# Patient Record
Sex: Male | Born: 1980 | Race: White | Hispanic: No | Marital: Single | Smoking: Never smoker
Health system: Southern US, Community
[De-identification: ages and names within clinical notes are randomized; demographics above are authoritative.]

## PROBLEM LIST (undated history)

## (undated) DIAGNOSIS — J45909 Unspecified asthma, uncomplicated: Secondary | ICD-10-CM

## (undated) HISTORY — PX: HERNIA REPAIR: SHX51

## (undated) HISTORY — PX: LIPOMA RESECTION: SHX23

## (undated) HISTORY — DX: Unspecified asthma, uncomplicated: J45.909

## (undated) HISTORY — PX: TONSILLECTOMY: SUR1361

---

## 2011-11-08 ENCOUNTER — Encounter (INDEPENDENT_AMBULATORY_CARE_PROVIDER_SITE_OTHER): Payer: Self-pay

## 2011-11-18 ENCOUNTER — Ambulatory Visit (INDEPENDENT_AMBULATORY_CARE_PROVIDER_SITE_OTHER): Payer: Self-pay | Admitting: Surgery

## 2012-12-25 ENCOUNTER — Ambulatory Visit (INDEPENDENT_AMBULATORY_CARE_PROVIDER_SITE_OTHER): Payer: Self-pay | Admitting: Surgery

## 2013-05-26 ENCOUNTER — Other Ambulatory Visit: Payer: Self-pay | Admitting: Family Medicine

## 2013-05-26 DIAGNOSIS — N503 Cyst of epididymis: Secondary | ICD-10-CM

## 2013-05-27 ENCOUNTER — Ambulatory Visit
Admission: RE | Admit: 2013-05-27 | Discharge: 2013-05-27 | Disposition: A | Discharge status: Home / Self Care | Payer: No Typology Code available for payment source | Source: Ambulatory Visit | Attending: Family Medicine | Admitting: Family Medicine

## 2013-05-27 DIAGNOSIS — N503 Cyst of epididymis: Secondary | ICD-10-CM

## 2013-06-10 ENCOUNTER — Encounter (INDEPENDENT_AMBULATORY_CARE_PROVIDER_SITE_OTHER): Payer: Self-pay

## 2013-06-10 ENCOUNTER — Encounter (INDEPENDENT_AMBULATORY_CARE_PROVIDER_SITE_OTHER): Payer: Self-pay | Admitting: General Surgery

## 2013-06-10 ENCOUNTER — Ambulatory Visit (INDEPENDENT_AMBULATORY_CARE_PROVIDER_SITE_OTHER): Payer: No Typology Code available for payment source | Admitting: General Surgery

## 2013-06-10 VITALS — BP 123/77 | HR 67 | Temp 97.7°F | Resp 14 | Ht 70.0 in | Wt 186.0 lb

## 2013-06-10 DIAGNOSIS — D179 Benign lipomatous neoplasm, unspecified: Secondary | ICD-10-CM

## 2013-06-10 NOTE — Progress Notes (Signed)
Patient ID: Dennis Wilson, male   DOB: 09/06/1980, 32 y.o.   MRN: [id number]  Chief Complaint  Patient presents with  . Lipoma    HPI Dennis Wilson is a 32 y.o. male.  The patient is a 31 year old male with a back lipoma. Patient states that for several years. There was a previous attempt for excision [facility] office was secondary to bleeding it was aborted. Patient presents now for surgical excision. HPI  Past Medical History  Diagnosis Date  . Asthma     Past Surgical History  Procedure Laterality Date  . Tonsillectomy    . Hernia repair    . Lipoma resection      History reviewed. No pertinent family history.  Social History History  Substance Use Topics  . Smoking status: Never Smoker   . Smokeless tobacco: Not on file  . Alcohol Use: Yes     Comment: rarely    Allergies  Allergen Reactions  . Ceclor [Cefaclor]     Current Outpatient Prescriptions  Medication Sig Dispense Refill  . albuterol (PROVENTIL HFA;VENTOLIN HFA) 108 (90 BASE) MCG/ACT inhaler Inhale 2 puffs into the lungs every 6 (six) hours as needed.      . loratadine (CLARITIN) 10 MG tablet Take 10 mg by mouth daily.      . Multiple Vitamin (MULTIVITAMIN) tablet Take 1 tablet by mouth daily.       No current facility-administered medications for this visit.    Review of Systems Review of Systems  Constitutional: Negative.   HENT: Negative.   Respiratory: Negative.   Cardiovascular: Negative.   Gastrointestinal: Negative.   Neurological: Negative.   All other systems reviewed and are negative.    Blood pressure 123/77, pulse 67, temperature 97.7 F (36.5 C), temperature source Temporal, resp. rate 14, height 5\' 10"  (1.778 m), weight 186 lb (84.369 kg).  Physical Exam Physical Exam  Constitutional: He is oriented to person, place, and time. He appears well-developed and well-nourished.  HENT:  Head: Normocephalic and atraumatic.  Eyes: Conjunctivae and EOM are normal.  Pupils are equal, round, and reactive to light.  Neck: Normal range of motion. Neck supple.  Cardiovascular: Normal rate, regular rhythm and normal heart sounds.   Pulmonary/Chest: Effort normal and breath sounds normal.    Approximately 4 x 4 centimeter subcutaneous lipoma  Abdominal: Soft. Bowel sounds are normal.  Musculoskeletal: Normal range of motion.  Neurological: He is alert and oriented to person, place, and time.  Skin: Skin is warm and dry.    Data Reviewed none  Assessment    32 year old male with a back lipoma approximately 4 x 5 cm in size in the subcutaneous space     Plan    1. We'll proceed to the operative for an excision of back lipoma 2. Discussed with him the risks and benefits of the procedure to include but not limited to: Bleeding, hematoma, possible seroma formation, and possible wound healing complications. The patient voiced understanding and wished to proceed.        Marigene Ehlers., Kacyn Souder 06/10/2013, 9:06 AM

## 2013-07-07 ENCOUNTER — Encounter (INDEPENDENT_AMBULATORY_CARE_PROVIDER_SITE_OTHER): Payer: Self-pay | Admitting: General Surgery

## 2013-07-09 ENCOUNTER — Telehealth (INDEPENDENT_AMBULATORY_CARE_PROVIDER_SITE_OTHER): Payer: Self-pay

## 2013-07-09 NOTE — Telephone Encounter (Signed)
Called and spoke to patient regarding his anesthesia requests.  Reviewed patient request with Dr. Derrell Lolling, he will discuss anesthesia options the day of surgery with the patient and the Anesthesiologist.  Patient verbalized understanding and agrees with plan as above.

## 2013-07-13 ENCOUNTER — Encounter (INDEPENDENT_AMBULATORY_CARE_PROVIDER_SITE_OTHER): Payer: Self-pay | Admitting: General Surgery

## 2013-07-19 ENCOUNTER — Other Ambulatory Visit (INDEPENDENT_AMBULATORY_CARE_PROVIDER_SITE_OTHER): Payer: Self-pay | Admitting: *Deleted

## 2013-07-19 DIAGNOSIS — D1739 Benign lipomatous neoplasm of skin and subcutaneous tissue of other sites: Secondary | ICD-10-CM

## 2013-07-19 MED ORDER — OXYCODONE-ACETAMINOPHEN 5-325 MG PO TABS: 1.0000 | ORAL_TABLET | ORAL | Status: DC | PRN

## 2013-07-27 ENCOUNTER — Encounter (INDEPENDENT_AMBULATORY_CARE_PROVIDER_SITE_OTHER): Payer: Self-pay | Admitting: General Surgery

## 2013-08-13 ENCOUNTER — Ambulatory Visit (INDEPENDENT_AMBULATORY_CARE_PROVIDER_SITE_OTHER): Payer: No Typology Code available for payment source | Admitting: General Surgery

## 2013-08-13 ENCOUNTER — Encounter (INDEPENDENT_AMBULATORY_CARE_PROVIDER_SITE_OTHER): Payer: Self-pay | Admitting: General Surgery

## 2013-08-13 VITALS — BP 127/84 | HR 66 | Temp 97.1°F | Resp 14 | Ht 70.0 in | Wt 193.2 lb

## 2013-08-13 DIAGNOSIS — Z9889 Other specified postprocedural states: Secondary | ICD-10-CM

## 2013-08-13 NOTE — Progress Notes (Signed)
Patient ID: Dennis Wilson, male   DOB: June 16, 1981, 33 y.o.   MRN: [id number] Post op course Patient is a 33 year old male status post back mass removal Patient had no issues postoperatively. He said to workout at this point.  On Exam: Wounds are clean dry and intact  Pathology:   Lipoma with no dysplasia.  This was discussed with the patient.  Assessment and Plan A 33 year old status post back mass removal 1. Patient can continue with normal activity at this time. 2. Patient to follow up as needed   Ralene Ok, MD [facility] General [facility] Minimally Invasive Surgery Trauma & Emergency Surgery

## 2013-08-24 ENCOUNTER — Encounter (INDEPENDENT_AMBULATORY_CARE_PROVIDER_SITE_OTHER): Payer: Self-pay

## 2014-11-10 IMAGING — US US SCROTUM
1 series · 13 of 25 positions shown · non-contrast
Comparison: None.

CLINICAL DATA: Probable epididymal cyst on the left.

EXAM:
SCROTAL ULTRASOUND
DOPPLER ULTRASOUND OF THE TESTICLES
TECHNIQUE: Complete ultrasound examination of the testicles, epididymis, and
other scrotal structures was performed. Color and spectral Doppler
ultrasound were also utilized to evaluate blood flow to the
testicles.

[Series 1: us scrotum · 0.08mm/px · 13 of 59 slices shown]
[im 1/59]
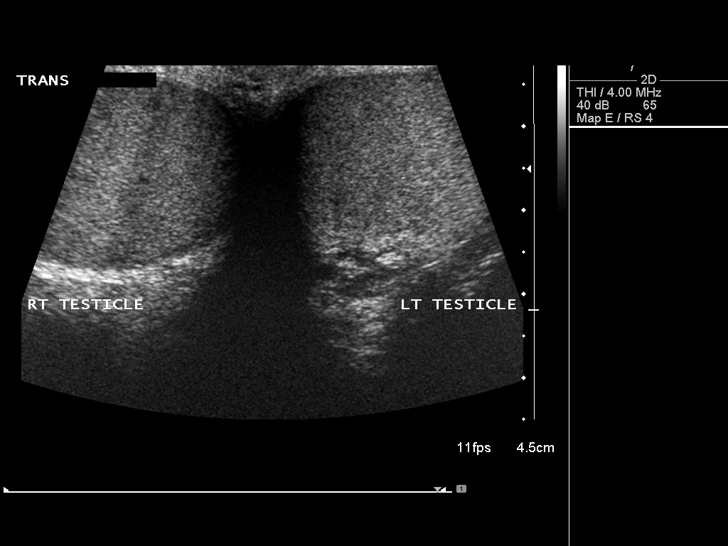
[im 5/59]
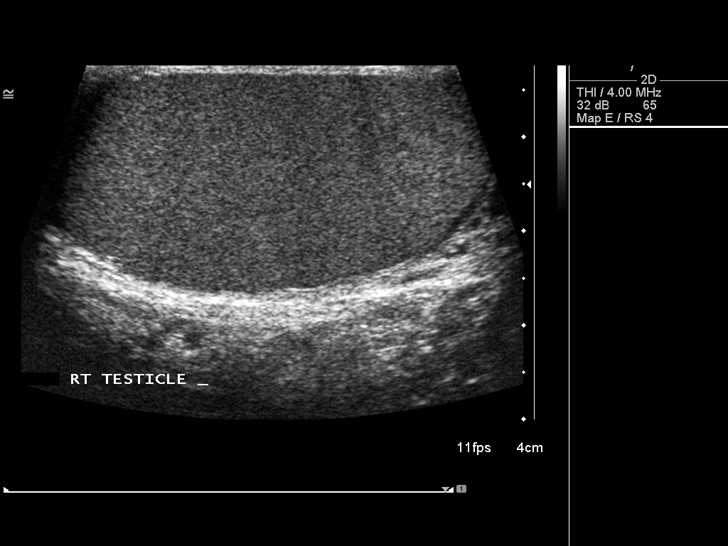
[im 10/59]
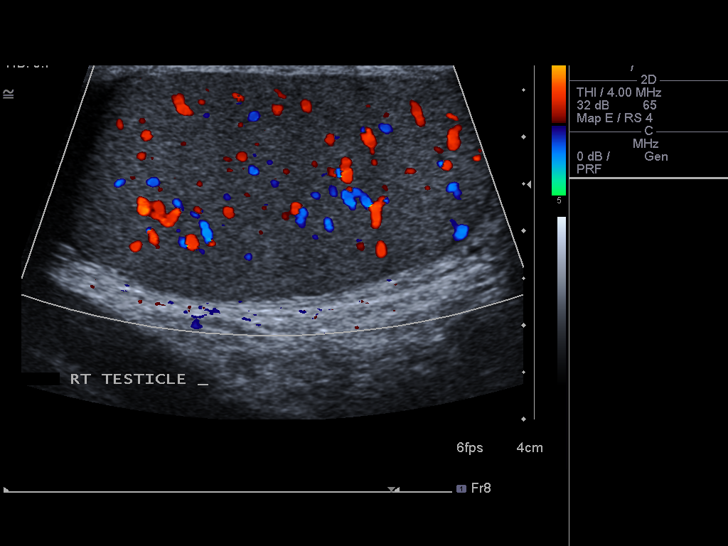
[im 15/59]
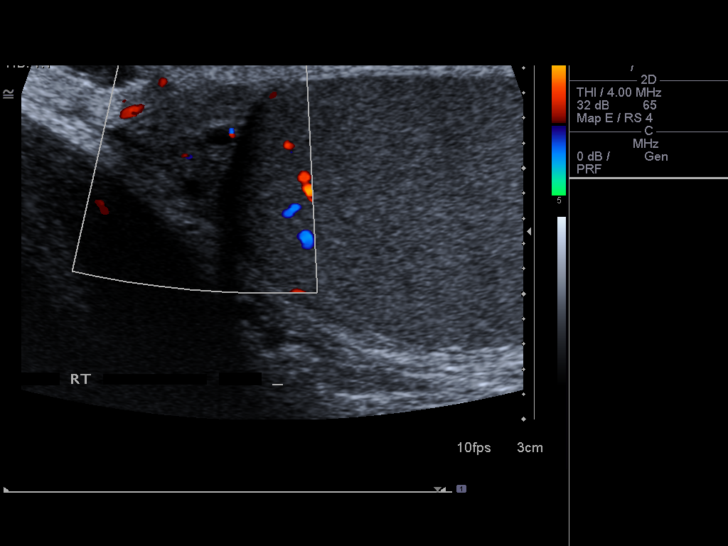
[im 20/59]
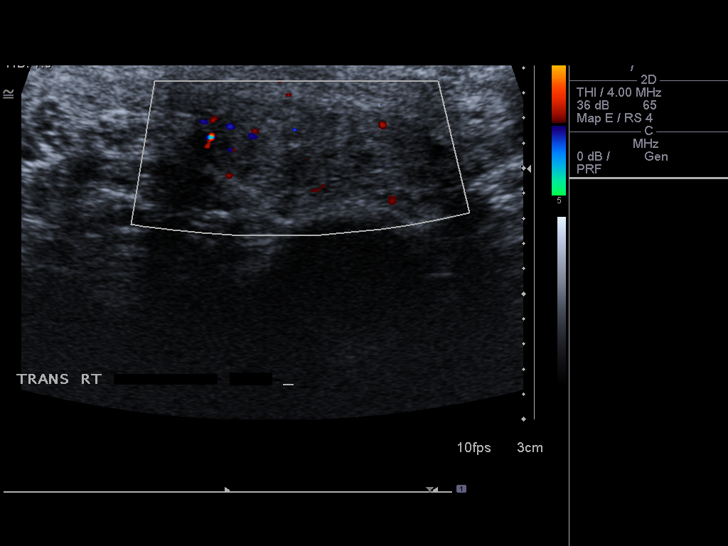
[im 25/59]
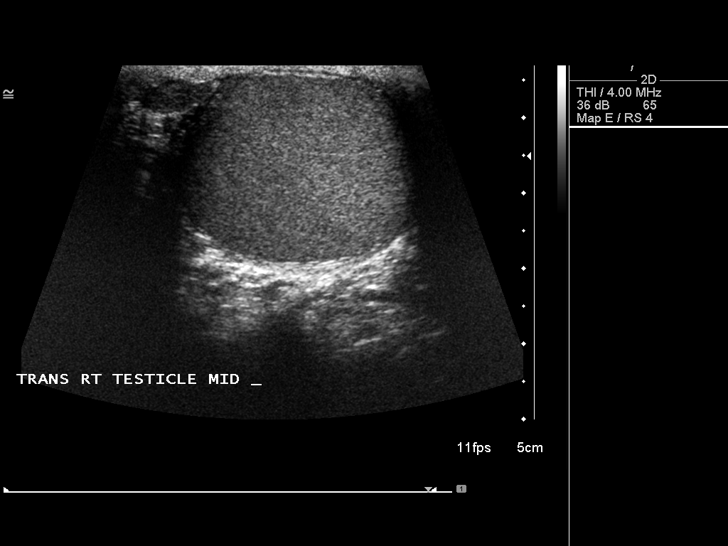
[im 30/59]
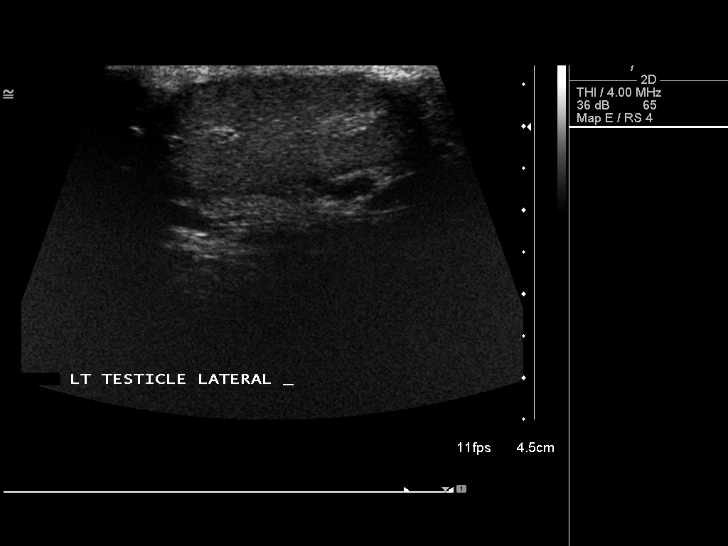
[im 34/59]
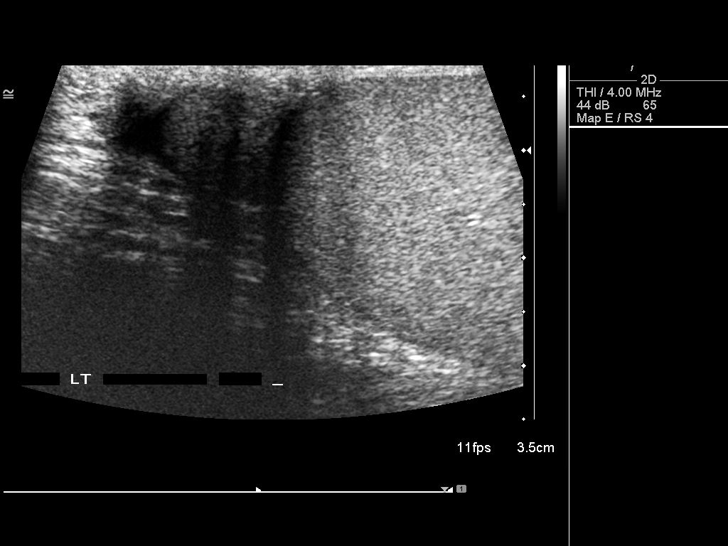
[im 39/59]
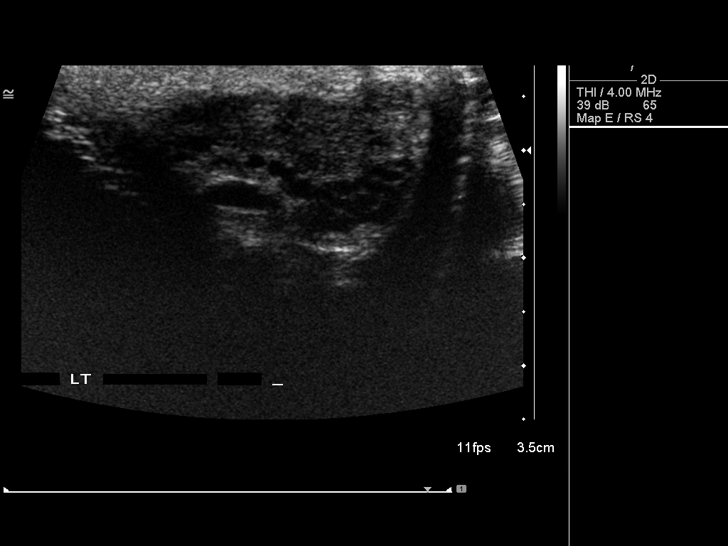
[im 44/59]
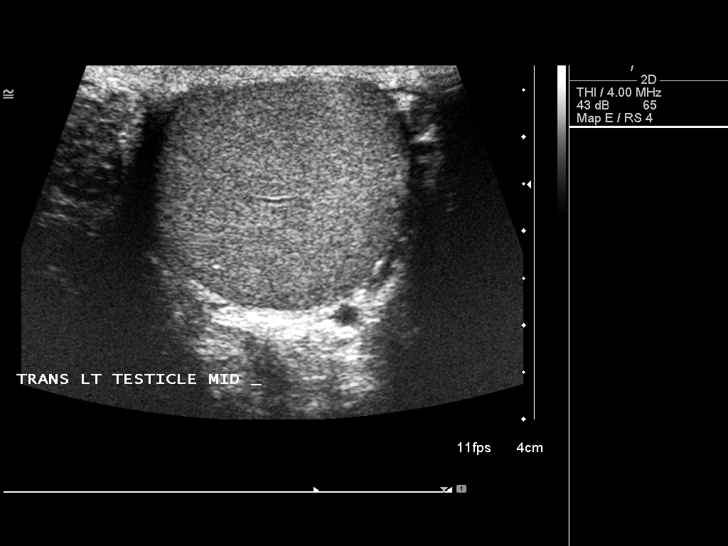
[im 49/59]
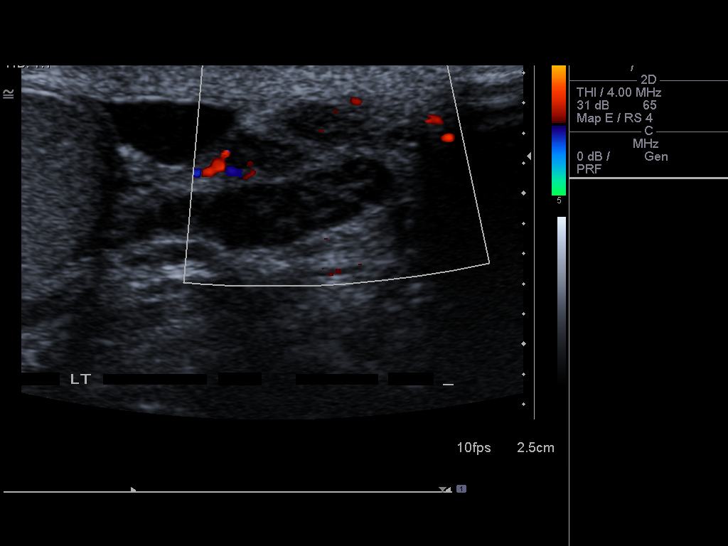
[im 54/59]
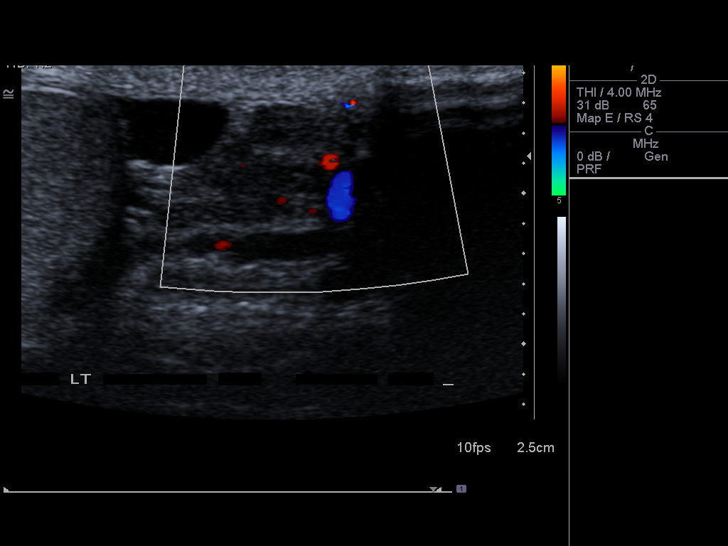
[im 59/59]
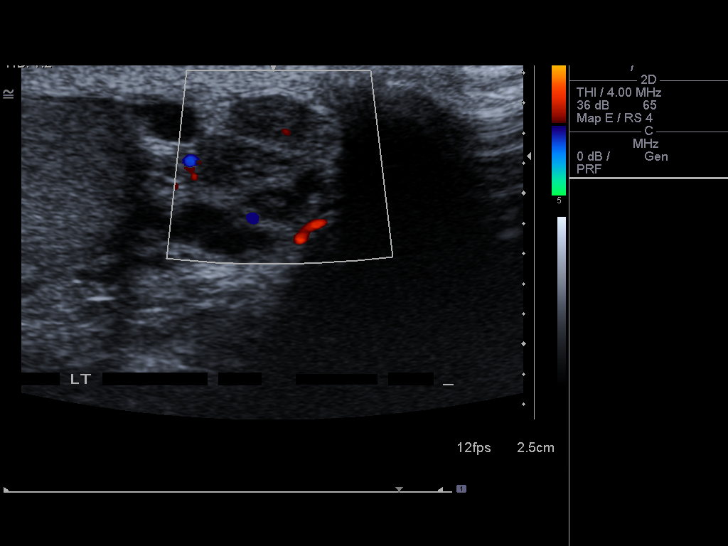

[13 of 25 positions shown; findings below may reference images not displayed]

FINDINGS: Right testicle

Measurements: 4.7 x 2.4 x 3.1 cm No mass or microlithiasis
visualized.

Left testicle

Measurements: 4.4 x 2.2 x 2.7 cm No mass or microlithiasis
visualized.

Right epididymis: Small epididymal cysts measuring approximately 2
mm.

Left epididymis: The epididymal head on the left is normal. In the
epididymal tail, there is a heterogeneous hypoechoic nodule
measuring 5.5 mm which corresponds to the palpable abnormality. This
shows mild color flow by Doppler and therefore appears to be a solid
nodule.

Hydrocele:  None visualized.

Varicocele:  None visualized.

Pulsed Doppler interrogation of both testes demonstrates low
resistance arterial and venous waveforms bilaterally.
IMPRESSION: Normal testes

5.5 mm hypoechoic solid nodule epididymal tail on the left
corresponds to the palpable abnormality. This appears to show blood
flow on Doppler. Given the small size, this is likely a benign
lesion possibly inflammatory. Benign or malignant neoplasm also
possible.

## 2015-11-28 DIAGNOSIS — R748 Abnormal levels of other serum enzymes: Secondary | ICD-10-CM | POA: Diagnosis not present

## 2015-11-28 DIAGNOSIS — R899 Unspecified abnormal finding in specimens from other organs, systems and tissues: Secondary | ICD-10-CM | POA: Diagnosis not present

## 2016-08-19 DIAGNOSIS — J069 Acute upper respiratory infection, unspecified: Secondary | ICD-10-CM | POA: Diagnosis not present

## 2016-08-27 DIAGNOSIS — R030 Elevated blood-pressure reading, without diagnosis of hypertension: Secondary | ICD-10-CM | POA: Diagnosis not present

## 2016-08-27 DIAGNOSIS — J4531 Mild persistent asthma with (acute) exacerbation: Secondary | ICD-10-CM | POA: Diagnosis not present

## 2016-08-27 DIAGNOSIS — Z113 Encounter for screening for infections with a predominantly sexual mode of transmission: Secondary | ICD-10-CM | POA: Diagnosis not present

## 2016-08-27 DIAGNOSIS — Z13220 Encounter for screening for lipoid disorders: Secondary | ICD-10-CM | POA: Diagnosis not present

## 2016-08-27 DIAGNOSIS — R7989 Other specified abnormal findings of blood chemistry: Secondary | ICD-10-CM | POA: Diagnosis not present

## 2016-08-27 DIAGNOSIS — R748 Abnormal levels of other serum enzymes: Secondary | ICD-10-CM | POA: Diagnosis not present

## 2016-08-27 DIAGNOSIS — Z0000 Encounter for general adult medical examination without abnormal findings: Secondary | ICD-10-CM | POA: Diagnosis not present

## 2017-01-02 DIAGNOSIS — R300 Dysuria: Secondary | ICD-10-CM | POA: Diagnosis not present

## 2017-01-02 DIAGNOSIS — R748 Abnormal levels of other serum enzymes: Secondary | ICD-10-CM | POA: Diagnosis not present

## 2017-01-02 DIAGNOSIS — Z7721 Contact with and (suspected) exposure to potentially hazardous body fluids: Secondary | ICD-10-CM | POA: Diagnosis not present

## 2017-01-02 DIAGNOSIS — B070 Plantar wart: Secondary | ICD-10-CM | POA: Diagnosis not present

## 2017-09-19 DIAGNOSIS — Z202 Contact with and (suspected) exposure to infections with a predominantly sexual mode of transmission: Secondary | ICD-10-CM | POA: Diagnosis not present

## 2017-09-19 DIAGNOSIS — R7989 Other specified abnormal findings of blood chemistry: Secondary | ICD-10-CM | POA: Diagnosis not present

## 2017-09-19 DIAGNOSIS — Z0000 Encounter for general adult medical examination without abnormal findings: Secondary | ICD-10-CM | POA: Diagnosis not present

## 2017-09-19 DIAGNOSIS — E782 Mixed hyperlipidemia: Secondary | ICD-10-CM | POA: Diagnosis not present

## 2017-09-19 DIAGNOSIS — Z23 Encounter for immunization: Secondary | ICD-10-CM | POA: Diagnosis not present

## 2017-09-19 DIAGNOSIS — R944 Abnormal results of kidney function studies: Secondary | ICD-10-CM | POA: Diagnosis not present

## 2017-12-08 DIAGNOSIS — M9901 Segmental and somatic dysfunction of cervical region: Secondary | ICD-10-CM | POA: Diagnosis not present

## 2017-12-08 DIAGNOSIS — M9902 Segmental and somatic dysfunction of thoracic region: Secondary | ICD-10-CM | POA: Diagnosis not present

## 2017-12-08 DIAGNOSIS — M791 Myalgia, unspecified site: Secondary | ICD-10-CM | POA: Diagnosis not present

## 2017-12-08 DIAGNOSIS — M542 Cervicalgia: Secondary | ICD-10-CM | POA: Diagnosis not present

## 2017-12-10 DIAGNOSIS — Z83518 Family history of other specified eye disorder: Secondary | ICD-10-CM | POA: Diagnosis not present

## 2017-12-11 DIAGNOSIS — H35383 Toxic maculopathy, bilateral: Secondary | ICD-10-CM | POA: Diagnosis not present

## 2017-12-11 DIAGNOSIS — Z135 Encounter for screening for eye and ear disorders: Secondary | ICD-10-CM | POA: Diagnosis not present

## 2017-12-11 DIAGNOSIS — Z83518 Family history of other specified eye disorder: Secondary | ICD-10-CM | POA: Diagnosis not present

## 2017-12-11 DIAGNOSIS — H3553 Other dystrophies primarily involving the sensory retina: Secondary | ICD-10-CM | POA: Diagnosis not present

## 2018-02-12 DIAGNOSIS — J301 Allergic rhinitis due to pollen: Secondary | ICD-10-CM | POA: Diagnosis not present

## 2018-02-12 DIAGNOSIS — R030 Elevated blood-pressure reading, without diagnosis of hypertension: Secondary | ICD-10-CM | POA: Diagnosis not present

## 2018-03-19 DIAGNOSIS — N289 Disorder of kidney and ureter, unspecified: Secondary | ICD-10-CM | POA: Diagnosis not present

## 2018-03-19 DIAGNOSIS — J45990 Exercise induced bronchospasm: Secondary | ICD-10-CM | POA: Diagnosis not present

## 2018-06-04 DIAGNOSIS — Z83518 Family history of other specified eye disorder: Secondary | ICD-10-CM | POA: Diagnosis not present

## 2018-06-04 DIAGNOSIS — H35383 Toxic maculopathy, bilateral: Secondary | ICD-10-CM | POA: Diagnosis not present

## 2018-09-29 DIAGNOSIS — E782 Mixed hyperlipidemia: Secondary | ICD-10-CM | POA: Diagnosis not present

## 2018-09-29 DIAGNOSIS — R748 Abnormal levels of other serum enzymes: Secondary | ICD-10-CM | POA: Diagnosis not present

## 2018-09-29 DIAGNOSIS — Z202 Contact with and (suspected) exposure to infections with a predominantly sexual mode of transmission: Secondary | ICD-10-CM | POA: Diagnosis not present

## 2018-09-29 DIAGNOSIS — R7989 Other specified abnormal findings of blood chemistry: Secondary | ICD-10-CM | POA: Diagnosis not present

## 2018-09-29 DIAGNOSIS — Z0000 Encounter for general adult medical examination without abnormal findings: Secondary | ICD-10-CM | POA: Diagnosis not present

## 2019-03-04 DIAGNOSIS — H35383 Toxic maculopathy, bilateral: Secondary | ICD-10-CM | POA: Diagnosis not present

## 2019-03-04 DIAGNOSIS — Z1589 Genetic susceptibility to other disease: Secondary | ICD-10-CM | POA: Diagnosis not present

## 2019-03-04 DIAGNOSIS — H53413 Scotoma involving central area, bilateral: Secondary | ICD-10-CM | POA: Diagnosis not present

## 2019-03-04 DIAGNOSIS — Z83518 Family history of other specified eye disorder: Secondary | ICD-10-CM | POA: Diagnosis not present

## 2019-05-06 DIAGNOSIS — H938X1 Other specified disorders of right ear: Secondary | ICD-10-CM | POA: Diagnosis not present

## 2019-05-06 DIAGNOSIS — H6121 Impacted cerumen, right ear: Secondary | ICD-10-CM | POA: Diagnosis not present

## 2019-05-14 DIAGNOSIS — Z20828 Contact with and (suspected) exposure to other viral communicable diseases: Secondary | ICD-10-CM | POA: Diagnosis not present

## 2019-07-09 DIAGNOSIS — L309 Dermatitis, unspecified: Secondary | ICD-10-CM | POA: Diagnosis not present

## 2019-07-09 DIAGNOSIS — Z91018 Allergy to other foods: Secondary | ICD-10-CM | POA: Diagnosis not present

## 2019-07-09 DIAGNOSIS — H1031 Unspecified acute conjunctivitis, right eye: Secondary | ICD-10-CM | POA: Diagnosis not present

## 2019-07-13 DIAGNOSIS — Z202 Contact with and (suspected) exposure to infections with a predominantly sexual mode of transmission: Secondary | ICD-10-CM | POA: Diagnosis not present

## 2019-07-13 DIAGNOSIS — H109 Unspecified conjunctivitis: Secondary | ICD-10-CM | POA: Diagnosis not present

## 2019-07-17 DIAGNOSIS — L259 Unspecified contact dermatitis, unspecified cause: Secondary | ICD-10-CM | POA: Diagnosis not present

## 2019-10-05 DIAGNOSIS — Z0000 Encounter for general adult medical examination without abnormal findings: Secondary | ICD-10-CM | POA: Diagnosis not present

## 2019-10-05 DIAGNOSIS — R7989 Other specified abnormal findings of blood chemistry: Secondary | ICD-10-CM | POA: Diagnosis not present

## 2019-10-05 DIAGNOSIS — E782 Mixed hyperlipidemia: Secondary | ICD-10-CM | POA: Diagnosis not present

## 2020-04-05 DIAGNOSIS — Z1589 Genetic susceptibility to other disease: Secondary | ICD-10-CM | POA: Diagnosis not present

## 2020-04-05 DIAGNOSIS — H53413 Scotoma involving central area, bilateral: Secondary | ICD-10-CM | POA: Diagnosis not present

## 2020-04-05 DIAGNOSIS — Z83518 Family history of other specified eye disorder: Secondary | ICD-10-CM | POA: Diagnosis not present

## 2020-04-05 DIAGNOSIS — H35383 Toxic maculopathy, bilateral: Secondary | ICD-10-CM | POA: Diagnosis not present

## 2020-04-18 DIAGNOSIS — Z20828 Contact with and (suspected) exposure to other viral communicable diseases: Secondary | ICD-10-CM | POA: Diagnosis not present

## 2020-04-25 DIAGNOSIS — J4541 Moderate persistent asthma with (acute) exacerbation: Secondary | ICD-10-CM | POA: Diagnosis not present

## 2020-04-25 DIAGNOSIS — Z6827 Body mass index (BMI) 27.0-27.9, adult: Secondary | ICD-10-CM | POA: Diagnosis not present

## 2020-04-26 DIAGNOSIS — Z20828 Contact with and (suspected) exposure to other viral communicable diseases: Secondary | ICD-10-CM | POA: Diagnosis not present

## 2020-05-01 ENCOUNTER — Other Ambulatory Visit (HOSPITAL_COMMUNITY): Payer: Self-pay | Admitting: Nurse Practitioner

## 2020-05-01 DIAGNOSIS — R509 Fever, unspecified: Secondary | ICD-10-CM | POA: Diagnosis not present

## 2020-05-01 DIAGNOSIS — U071 COVID-19: Secondary | ICD-10-CM

## 2020-05-01 DIAGNOSIS — J4541 Moderate persistent asthma with (acute) exacerbation: Secondary | ICD-10-CM | POA: Diagnosis not present

## 2020-05-01 NOTE — Progress Notes (Signed)
I connected by phone with Dennis Wilson on 05/01/2020 at 4:37 PM to discuss the potential use of an new treatment for mild to moderate COVID-19 viral infection in non-hospitalized patients.  This patient is a 39 y.o. male that meets the FDA criteria for Emergency Use Authorization of casirivimab\imdevimab.  Has a (+) direct SARS-CoV-2 viral test result  Has mild or moderate COVID-19   Is ? 39 years of age and weighs ? 40 kg  Is NOT hospitalized due to COVID-19  Is NOT requiring oxygen therapy or requiring an increase in baseline oxygen flow rate due to COVID-19  Is within 10 days of symptom onset  Has at least one of the high risk factor(s) for progression to severe COVID-19 and/or hospitalization as defined in EUA.  Specific high risk criteria : Chronic Lung Disease   Onset 9/23. Vaccinated.    I have spoken and communicated the following to the patient or parent/caregiver:  1. FDA has authorized the emergency use of casirivimab\imdevimab for the treatment of mild to moderate COVID-19 in adults and pediatric patients with positive results of direct SARS-CoV-2 viral testing who are 71 years of age and older weighing at least 40 kg, and who are at high risk for progressing to severe COVID-19 and/or hospitalization.  2. The significant known and potential risks and benefits of casirivimab\imdevimab, and the extent to which such potential risks and benefits are unknown.  3. Information on available alternative treatments and the risks and benefits of those alternatives, including clinical trials.  4. Patients treated with casirivimab\imdevimab should continue to self-isolate and use infection control measures (e.g., wear mask, isolate, social distance, avoid sharing personal items, clean and disinfect "high touch" surfaces, and frequent handwashing) according to CDC guidelines.   5. The patient or parent/caregiver has the option to accept or refuse casirivimab\imdevimab .  After  reviewing this information with the patient, the patient has agreed to receive one of the available covid 19 monoclonal antibodies and will be provided an appropriate fact sheet prior to infusion.Beckey Rutter, [place], AGNP-C [phone] ([place])

## 2020-05-02 ENCOUNTER — Other Ambulatory Visit (HOSPITAL_COMMUNITY): Payer: Self-pay

## 2020-05-02 ENCOUNTER — Ambulatory Visit (HOSPITAL_COMMUNITY)
Admission: RE | Admit: 2020-05-02 | Discharge: 2020-05-02 | Disposition: A | Discharge status: Home / Self Care | Payer: BC Managed Care – PPO | Source: Ambulatory Visit | Attending: Pulmonary Disease | Admitting: Pulmonary Disease

## 2020-05-02 DIAGNOSIS — U071 COVID-19: Secondary | ICD-10-CM

## 2020-05-02 MED ORDER — SODIUM CHLORIDE 0.9 % IV SOLN: INTRAVENOUS | Status: DC | PRN

## 2020-05-02 MED ORDER — METHYLPREDNISOLONE SODIUM SUCC 125 MG IJ SOLR: 125.0000 mg | Freq: Once | INTRAMUSCULAR | Status: DC | PRN

## 2020-05-02 MED ORDER — FAMOTIDINE IN NACL 20-0.9 MG/50ML-% IV SOLN: 20.0000 mg | Freq: Once | INTRAVENOUS | Status: DC | PRN

## 2020-05-02 MED ORDER — ALBUTEROL SULFATE HFA 108 (90 BASE) MCG/ACT IN AERS: 2.0000 | INHALATION_SPRAY | Freq: Once | RESPIRATORY_TRACT | Status: DC | PRN

## 2020-05-02 MED ORDER — DIPHENHYDRAMINE HCL 50 MG/ML IJ SOLN: 50.0000 mg | Freq: Once | INTRAMUSCULAR | Status: DC | PRN

## 2020-05-02 MED ORDER — SODIUM CHLORIDE 0.9 % IV SOLN
1200.0000 mg | Freq: Once | INTRAVENOUS | Status: AC
  Administered 2020-05-02: 1200 mg via INTRAVENOUS

## 2020-05-02 MED ORDER — EPINEPHRINE 0.3 MG/0.3ML IJ SOAJ: 0.3000 mg | Freq: Once | INTRAMUSCULAR | Status: DC | PRN

## 2020-05-02 NOTE — Discharge Instructions (Signed)

## 2020-05-02 NOTE — Progress Notes (Signed)
  Diagnosis: COVID-19  Physician:Dr Wright  Procedure: [facility] Infusion [facility] Med: casirivimab\imdevimab infusion - Provided patient with casirivimab\imdevimab fact sheet for patients, parents and caregivers prior to infusion.  Complications: No immediate complications noted.  Discharge: Discharged home   Dennis Wilson 05/02/2020  

## 2020-07-24 DIAGNOSIS — Z20822 Contact with and (suspected) exposure to covid-19: Secondary | ICD-10-CM | POA: Diagnosis not present

## 2020-10-05 DIAGNOSIS — Z0000 Encounter for general adult medical examination without abnormal findings: Secondary | ICD-10-CM | POA: Diagnosis not present

## 2020-10-05 DIAGNOSIS — E782 Mixed hyperlipidemia: Secondary | ICD-10-CM | POA: Diagnosis not present

## 2020-10-17 DIAGNOSIS — R899 Unspecified abnormal finding in specimens from other organs, systems and tissues: Secondary | ICD-10-CM | POA: Diagnosis not present

## 2021-04-11 DIAGNOSIS — Z1589 Genetic susceptibility to other disease: Secondary | ICD-10-CM | POA: Diagnosis not present

## 2021-04-11 DIAGNOSIS — H53413 Scotoma involving central area, bilateral: Secondary | ICD-10-CM | POA: Diagnosis not present

## 2021-04-11 DIAGNOSIS — H35383 Toxic maculopathy, bilateral: Secondary | ICD-10-CM | POA: Diagnosis not present

## 2021-04-11 DIAGNOSIS — Z83518 Family history of other specified eye disorder: Secondary | ICD-10-CM | POA: Diagnosis not present

## 2021-10-08 DIAGNOSIS — Z0000 Encounter for general adult medical examination without abnormal findings: Secondary | ICD-10-CM | POA: Diagnosis not present

## 2021-10-09 DIAGNOSIS — R899 Unspecified abnormal finding in specimens from other organs, systems and tissues: Secondary | ICD-10-CM | POA: Diagnosis not present

## 2021-10-09 DIAGNOSIS — Z0000 Encounter for general adult medical examination without abnormal findings: Secondary | ICD-10-CM | POA: Diagnosis not present

## 2021-10-09 DIAGNOSIS — Z131 Encounter for screening for diabetes mellitus: Secondary | ICD-10-CM | POA: Diagnosis not present

## 2021-10-09 DIAGNOSIS — Z13220 Encounter for screening for lipoid disorders: Secondary | ICD-10-CM | POA: Diagnosis not present

## 2021-11-29 DIAGNOSIS — E349 Endocrine disorder, unspecified: Secondary | ICD-10-CM | POA: Diagnosis not present

## 2021-11-29 DIAGNOSIS — R899 Unspecified abnormal finding in specimens from other organs, systems and tissues: Secondary | ICD-10-CM | POA: Diagnosis not present

## 2022-04-17 DIAGNOSIS — Z1589 Genetic susceptibility to other disease: Secondary | ICD-10-CM | POA: Diagnosis not present

## 2022-04-17 DIAGNOSIS — H35383 Toxic maculopathy, bilateral: Secondary | ICD-10-CM | POA: Diagnosis not present

## 2022-04-17 DIAGNOSIS — Z83518 Family history of other specified eye disorder: Secondary | ICD-10-CM | POA: Diagnosis not present

## 2022-04-17 DIAGNOSIS — H53413 Scotoma involving central area, bilateral: Secondary | ICD-10-CM | POA: Diagnosis not present

## 2024-05-19 ENCOUNTER — Other Ambulatory Visit (HOSPITAL_BASED_OUTPATIENT_CLINIC_OR_DEPARTMENT_OTHER): Payer: Self-pay | Admitting: Urology

## 2024-05-19 DIAGNOSIS — N5089 Other specified disorders of the male genital organs: Secondary | ICD-10-CM

## 2024-06-01 ENCOUNTER — Emergency Department (HOSPITAL_BASED_OUTPATIENT_CLINIC_OR_DEPARTMENT_OTHER): Admitting: Radiology

## 2024-06-01 ENCOUNTER — Emergency Department (HOSPITAL_BASED_OUTPATIENT_CLINIC_OR_DEPARTMENT_OTHER)
Admission: EM | Admit: 2024-06-01 | Discharge: 2024-06-02 | Disposition: A | Discharge status: Home / Self Care | Attending: Emergency Medicine | Admitting: Emergency Medicine

## 2024-06-01 ENCOUNTER — Other Ambulatory Visit (HOSPITAL_BASED_OUTPATIENT_CLINIC_OR_DEPARTMENT_OTHER)

## 2024-06-01 ENCOUNTER — Other Ambulatory Visit: Payer: Self-pay

## 2024-06-01 ENCOUNTER — Encounter (HOSPITAL_BASED_OUTPATIENT_CLINIC_OR_DEPARTMENT_OTHER): Payer: Self-pay | Admitting: Emergency Medicine

## 2024-06-01 DIAGNOSIS — J45909 Unspecified asthma, uncomplicated: Secondary | ICD-10-CM | POA: Diagnosis not present

## 2024-06-01 DIAGNOSIS — I160 Hypertensive urgency: Secondary | ICD-10-CM | POA: Insufficient documentation

## 2024-06-01 DIAGNOSIS — I10 Essential (primary) hypertension: Secondary | ICD-10-CM | POA: Diagnosis present

## 2024-06-01 DIAGNOSIS — H539 Unspecified visual disturbance: Secondary | ICD-10-CM

## 2024-06-01 LAB — CBC
HCT: 49.3 % (ref 39.0–52.0)
Hemoglobin: 16.6 g/dL (ref 13.0–17.0)
MCH: 28.5 pg (ref 26.0–34.0)
MCHC: 33.7 g/dL (ref 30.0–36.0)
MCV: 84.7 fL (ref 80.0–100.0)
Platelets: 266 K/uL (ref 150–400)
RBC: 5.82 MIL/uL — ABNORMAL HIGH (ref 4.22–5.81)
RDW: 12.3 % (ref 11.5–15.5)
WBC: 7.2 K/uL (ref 4.0–10.5)
nRBC: 0 % (ref 0.0–0.2)

## 2024-06-01 LAB — BASIC METABOLIC PANEL WITH GFR
Anion gap: 12 (ref 5–15)
BUN: 18 mg/dL (ref 6–20)
CO2: 27 mmol/L (ref 22–32)
Calcium: 9.8 mg/dL (ref 8.9–10.3)
Chloride: 100 mmol/L (ref 98–111)
Creatinine, Ser: 1.31 mg/dL — ABNORMAL HIGH (ref 0.61–1.24)
GFR, Estimated: >60 mL/min (ref >60)
Glucose, Bld: 106 mg/dL — ABNORMAL HIGH (ref 70–99)
Potassium: 4 mmol/L (ref 3.5–5.1)
Sodium: 138 mmol/L (ref 135–145)

## 2024-06-01 LAB — D-DIMER, QUANTITATIVE: D-Dimer, Quant: <0.27 ug{FEU}/mL (ref 0.00–0.50)

## 2024-06-01 LAB — TROPONIN T, HIGH SENSITIVITY: Troponin T High Sensitivity: <15 ng/L (ref 0–19)

## 2024-06-01 NOTE — ED Triage Notes (Signed)
 Reports HTN at PCP. Was prescribed BP meds today at PCP. Denies CP or SHOB.

## 2024-06-02 ENCOUNTER — Emergency Department (HOSPITAL_COMMUNITY)

## 2024-06-02 ENCOUNTER — Emergency Department (HOSPITAL_BASED_OUTPATIENT_CLINIC_OR_DEPARTMENT_OTHER)

## 2024-06-02 ENCOUNTER — Ambulatory Visit (HOSPITAL_BASED_OUTPATIENT_CLINIC_OR_DEPARTMENT_OTHER)

## 2024-06-02 ENCOUNTER — Encounter (HOSPITAL_BASED_OUTPATIENT_CLINIC_OR_DEPARTMENT_OTHER): Payer: Self-pay

## 2024-06-02 ENCOUNTER — Other Ambulatory Visit: Payer: Self-pay

## 2024-06-02 LAB — DIFFERENTIAL
Abs Immature Granulocytes: 0.01 K/uL (ref 0.00–0.07)
Basophils Absolute: 0.1 K/uL (ref 0.0–0.1)
Basophils Relative: 1 %
Eosinophils Absolute: 0.2 K/uL (ref 0.0–0.5)
Eosinophils Relative: 3 %
Immature Granulocytes: 0 %
Lymphocytes Relative: 47 %
Lymphs Abs: 3 K/uL (ref 0.7–4.0)
Monocytes Absolute: 0.6 K/uL (ref 0.1–1.0)
Monocytes Relative: 9 %
Neutro Abs: 2.6 K/uL (ref 1.7–7.7)
Neutrophils Relative %: 40 %

## 2024-06-02 LAB — CBC
HCT: 45.7 % (ref 39.0–52.0)
Hemoglobin: 15.8 g/dL (ref 13.0–17.0)
MCH: 29.2 pg (ref 26.0–34.0)
MCHC: 34.6 g/dL (ref 30.0–36.0)
MCV: 84.3 fL (ref 80.0–100.0)
Platelets: 241 K/uL (ref 150–400)
RBC: 5.42 MIL/uL (ref 4.22–5.81)
RDW: 12.4 % (ref 11.5–15.5)
WBC: 6.5 K/uL (ref 4.0–10.5)
nRBC: 0 % (ref 0.0–0.2)

## 2024-06-02 LAB — URINE DRUG SCREEN
Amphetamines: NEGATIVE
Barbiturates: NEGATIVE
Benzodiazepines: NEGATIVE
Cocaine: NEGATIVE
Fentanyl: NEGATIVE
Methadone Scn, Ur: NEGATIVE
Opiates: NEGATIVE
Tetrahydrocannabinol: NEGATIVE

## 2024-06-02 LAB — TROPONIN T, HIGH SENSITIVITY: Troponin T High Sensitivity: <15 ng/L (ref 0–19)

## 2024-06-02 LAB — PROTIME-INR
INR: 1.2 (ref 0.8–1.2)
Prothrombin Time: 16 s — ABNORMAL HIGH (ref 11.4–15.2)

## 2024-06-02 LAB — APTT: aPTT: 29 s (ref 24–36)

## 2024-06-02 MED ORDER — METOCLOPRAMIDE HCL 5 MG/ML IJ SOLN
10.0000 mg | Freq: Once | INTRAMUSCULAR | Status: AC
  Administered 2024-06-02: 10 mg via INTRAVENOUS
  Filled 2024-06-02: qty 2

## 2024-06-02 MED ORDER — IOHEXOL 350 MG/ML SOLN
75.0000 mL | Freq: Once | INTRAVENOUS | Status: AC | PRN
  Administered 2024-06-02: 75 mL via INTRAVENOUS

## 2024-06-02 MED ORDER — LABETALOL HCL 5 MG/ML IV SOLN
20.0000 mg | Freq: Once | INTRAVENOUS | Status: AC
  Administered 2024-06-02: 20 mg via INTRAVENOUS
  Filled 2024-06-02: qty 4

## 2024-06-02 MED ORDER — DIPHENHYDRAMINE HCL 50 MG/ML IJ SOLN
25.0000 mg | Freq: Once | INTRAMUSCULAR | Status: AC
Start: 2024-06-02 — End: 2024-06-02
  Administered 2024-06-02: 25 mg via INTRAVENOUS
  Filled 2024-06-02: qty 1

## 2024-06-02 MED ORDER — IOHEXOL 350 MG/ML SOLN
100.0000 mL | Freq: Once | INTRAVENOUS | Status: AC | PRN
  Administered 2024-06-02: 100 mL via INTRAVENOUS

## 2024-06-02 MED ORDER — AMLODIPINE BESYLATE 5 MG PO TABS: 5.0000 mg | ORAL_TABLET | Freq: Every day | ORAL | 0 refills | Status: AC

## 2024-06-02 MED ORDER — AMLODIPINE BESYLATE 5 MG PO TABS
5.0000 mg | ORAL_TABLET | Freq: Once | ORAL | Status: AC
  Administered 2024-06-02: 5 mg via ORAL
  Filled 2024-06-02: qty 1

## 2024-06-02 MED ORDER — GADOBUTROL 1 MMOL/ML IV SOLN
8.8000 mL | Freq: Once | INTRAVENOUS | Status: AC | PRN
  Administered 2024-06-02: 8.8 mL via INTRAVENOUS

## 2024-06-02 MED ORDER — HYDRALAZINE HCL 20 MG/ML IJ SOLN
5.0000 mg | Freq: Once | INTRAMUSCULAR | Status: AC
  Administered 2024-06-02: 5 mg via INTRAVENOUS
  Filled 2024-06-02: qty 1

## 2024-06-02 NOTE — ED Provider Notes (Signed)
 Patient presented to the ER for evaluation of elevated blood pressure and visual disturbance.  Patient recently saw his primary care doctor and was given a prescription for losartan although he had not started that medication regimen.  Patient was transferred from another emergency department to [facility] early this morning to have MRI.  Care was turned over from Dr. Jerral to me pending the MRI studies.  Patient's MRI does not show any acute abnormality.  Patient's blood pressure more severely elevated initially and that has improved.  Patient was given a prescription for Norvasc by Dr. Jerral but the patient states he also has a prescription for losartan that he did not start taking yet.  He will start with that medication.  Patient feels he may have had a complex migraine as he has had those before.  MRI fortunately does not show any signs of stroke or other acute abnormality.  Note:  images of an injury from another pt accidentally uploaded to pt's chart.  Message sent to medical records department to delete those images from Mr. Maltese's record   Randol Simmonds, MD 06/02/24 1100

## 2024-06-02 NOTE — ED Notes (Signed)
 Patient transported to MRI

## 2024-06-02 NOTE — ED Notes (Signed)
 Reviewed d/c instructions with pt and all questions answered. Pt verbalized understanding. Pt left in stable condition with all belongings.

## 2024-06-02 NOTE — ED Provider Notes (Signed)
 Care of patient received from prior provider at 5:07 AM, please see their note for complete H/P and care plan.  Reassessment: Patient received in transfer from stand-alone emergency room. He endorses that all symptoms are completely resolved after antihypertensive therapy as well as migraine therapy treatment. Transferred for MRI orbit/MRI brain for evaluation of Vaso- obstructive etiology. Still pending MRIs at time of handoff to oncoming team.  Patient remained asymptomatic.  Blood pressures downtrended substantially.     Jerral Meth, MD 06/02/24 [phone]

## 2024-06-02 NOTE — Consult Note (Signed)
 [facility] Consult Services   Patient Name:   Dennis Wilson, Dennis Wilson Date of Birth:   20-Oct-1980 Identification Number:   MRN - [id number] Date of Service:   06/02/2024 02:12:51  Diagnosis:       H53.8 - Blurred Vision  Impression:      This consult was conducted in real-time using interactive audio and video technology. Patient was informed of the technology being used for this visit and agreed to proceed. Patient located in hospital and provider located at home/office setting.    This is a 43 year old M with HTN (untreated), macular retinal dystrophy, ocular migraines who presents to [facility] [place] for complaints of elevated blood pressure and chest pain.    he reports he came to the ED due to high blood pressure and chest pressure for the last few days. He reports he has HTN but has chosen to try to manage it with diet, however this does not seem to be working he admits. The patient describes the chest pressure as a sensation similar to anxiety pressure.    While in the ED he reported transient blurred vision, so a stroke alert was called. Of note, the patient reports a history of macular retinal dystrophy, which he states has been a chronic issue for years, and he also experiences ocular migraines. he described his vision issue today as being binocular mostly central vision blurriness, which has since resolved. He had a mild headache. This episode is similar to his ocular migraines, but this episode is not as strong.    The patient's most recent BP is 186/114 mmHg.    On physical exam, the NIHSS is 0.    He appears to have medically untreated hypertension, and I suspect his blurred vision was either secondary to hypertensive urgency, or one of his ocular migraines, as the semiology sounds very similar and he does have a mild headache. He is not a tPa/Tnk candidate due to resolution of symptoms. CTH and CTA were already performed and  without acute pathology. Recs below.  Recommendations:       Euglycemia and Avoid Hyperthermia (PRN Acetaminophen )       Goal normotension.       If vision symptoms continue to remain resolved with blood pressure normalization, no additional neurologic tests are requested at this time.       If normalization of blood pressure causes vision deficits to return, change plan to permissive HTN and obtain MRI brain without contrast.  For headache:        PRN q6h "Headache Cocktails" consisting of the following, given at the same time: 1000 mg IV acetaminophen  (if available) or 1000 mg PO acetaminophen , 25 mg IV diphenhydramine , and 10 mg IV prochlorperazine.        Also, provide 1,000 mg IV magnesium sulfate once.        If the headache does not resolve after the above, then provide 500 mg IV divalproex sodium once.        ------------------------------------------------------------------------------  Advanced Imaging: CTA Head and Neck Completed.  LVO:No  Patient is not a candidate for NIR   Metrics: Last Known Well: Unknown Dispatch Time: 06/02/2024 02:12:51 Arrival Time: 06/01/2024 17:22:00 Initial Response Time: 06/02/2024 02:25:52 Symptoms: transient vision deficits. Initial patient interaction: 06/02/2024 02:57:10 NIHSS Assessment Completed: 06/02/2024 03:04:20 Patient is not a candidate for Thrombolytic. Thrombolytic Medical Decision: 06/02/2024 03:04:20 Patient was not deemed candidate for Thrombolytic because of following reasons: Resolved symptoms . Stroke severity too mild (non-disabling) .  CT Head: I personally reviewed all the CT images that were available to me and it showed: no hemorrhage  Primary Provider Notified of Diagnostic Impression and Management Plan on: 06/02/2024 03:18:32    ------------------------------------------------------------------------------  History of Present Illness: Patient is a 43 year old Male.  Patient was brought by  private transportation with symptoms of transient vision deficits. This consult was conducted in real-time using interactive audio and video technology. Patient was informed of the technology being used for this visit and agreed to proceed. Patient located in hospital and provider located at home/office setting.  This is a 43 year old M with HTN (untreated), macular retinal dystrophy, ocular migraines who presents to [facility], [place] for complaints of elevated blood pressure and chest pain.  he reports he came to the ED due to high blood pressure and chest pressure for the last few days. He reports he has HTN but has chosen to try to manage it with diet, however this does not seem to be working he admits. The patient describes the chest pressure as a sensation similar to anxiety pressure.  While in the ED he reported transient blurred vision, so a stroke alert was called. Of note, the patient reports a history of macular retinal dystrophy, which he states has been a chronic issue for years, and he also experiences ocular migraines. he described his vision issue today as being binocular mostly central vision blurriness, which has since resolved. He had a mild headache. This episode is similar to his ocular migraines, but this episode is not as strong.  The patient's most recent BP is 186/114 mmHg.  On physical exam, the NIHSS is 0.   Past Medical History: Other PMH:  HTN (untreated), macular retinal dystrophy, ocular migraines  Medications:  No Anticoagulant use  No Antiplatelet use Reviewed EMR for current medications  Allergies:  Reviewed  Social History: Drug Use: No  Family History:  There is no family history of premature cerebrovascular disease pertinent to this consultation  ROS : 14 Points Review of Systems was performed and was negative except mentioned in HPI.  Past Surgical History: There Is No Surgical History Contributory To Today's  Visit    Examination: BP(186/114), Pulse(75), 1A: Level of Consciousness - Alert; keenly responsive + 0 1B: Ask Month and Age - Both Questions Right + 0 1C: Blink Eyes & Squeeze Hands - Performs Both Tasks + 0 2: Test Horizontal Extraocular Movements - Normal + 0 3: Test Visual Fields - No Visual Loss + 0 4: Test Facial Palsy (Use Grimace if Obtunded) - Normal symmetry + 0 5A: Test Left Arm Motor Drift - No Drift for 10 Seconds + 0 5B: Test Right Arm Motor Drift - No Drift for 10 Seconds + 0 6A: Test Left Leg Motor Drift - No Drift for 5 Seconds + 0 6B: Test Right Leg Motor Drift - No Drift for 5 Seconds + 0 7: Test Limb Ataxia (FNF/Heel-Shin) - No Ataxia + 0 8: Test Sensation - Normal; No sensory loss + 0 9: Test Language/Aphasia - Normal; No aphasia + 0 10: Test Dysarthria - Normal + 0 11: Test Extinction/Inattention - No abnormality + 0  NIHSS Score: 0   Pre-Morbid Modified Rankin Scale: 0 Points = No symptoms at all  Spoke with : ed provider I reviewed the available imaging via [facility] and initiated discussion with the primary provider  This consult was conducted in real time using interactive audio and immunologist. Patient was informed of  the technology being used for this visit and agreed to proceed. Patient located in hospital and provider located at home/office setting.   Patient is being evaluated for possible acute neurologic impairment and high probability of imminent or life-threatening deterioration. I spent total of 70 minutes providing care to this patient, including time for face to face visit via telemedicine, review of medical records, imaging studies and discussion of findings with providers, the patient and/or family.    Dr Elspeth Narrow   [facility] For Inpatient follow-up with [facility] physician please call [facility] at [phone]. As we are not an outpatient service for any post hospital discharge needs please contact the hospital for  assistance. If you have any questions for the [facility] physicians or need to reconsult for clinical or diagnostic changes please contact us  via [facility] at [phone].  Non-radiologist review of imaging performed to assist with emergent clinical decision-making. Remote physician workstations do not possess the same resolution, calibration, or diagnostic capabilities as hospital-based radiology reading stations, and formal radiologist read is necessary.   Signature : Elspeth Narrow

## 2024-06-02 NOTE — Discharge Instructions (Addendum)
 Start taking the blood pressure medication prescribed by your doctor.  You do not need to start taking the amlodipine if you start taking the losartan.  Follow-up with your primary care doctor to recheck on your blood pressure.

## 2024-06-02 NOTE — ED Notes (Signed)
Patient walked to restroom

## 2024-06-02 NOTE — ED Provider Notes (Signed)
 [place] EMERGENCY DEPARTMENT AT [facility] Provider Note   CSN: [id number] Arrival date & time: 06/01/24  1722     Patient presents with: Hypertension   Dennis Wilson is a 43 y.o. male.   Patient with a history of asthma and elevated blood pressure here with elevated blood pressure.  States his blood pressure has been elevated for several months and he checks it on a regular basis but came in today because it was higher than usual.  He is some tightness in his chest.  States his blood pressure is normally 140s to 150s systolic but over the past day has been 180-190 systolic.  He had a virtual visit several days ago was prescribed losartan which he has not yet picked up.  Has not taken thing for blood pressures currently.  Has some blurry vision at baseline which is unchanged.  No new blurry vision changes.  No double vision.  Has some tightness in his chest but no pain.  Pain does not radiate to his arms, neck or back.  No shortness of breath, nausea, vomiting, cough or fever.  No abdominal pain.  No focal weakness, numbness or tingling.  No significant headache.  Does not take any for blood pressure currently.  States he has been checking on a regular basis and has been 140-160 at home but today's the first day became 180-190 systolic.  Denies room spinning dizziness or lightheadedness.  No focal weakness, numbness or tingling  The history is provided by the patient.  Hypertension Associated symptoms include chest pain. Pertinent negatives include no abdominal pain, no headaches and no shortness of breath.       Prior to Admission medications   Medication Sig Start Date End Date Taking? Authorizing Provider  albuterol  (PROVENTIL  HFA;VENTOLIN  HFA) 108 (90 BASE) MCG/ACT inhaler Inhale 2 puffs into the lungs every 6 (six) hours as needed.    [provider]  loratadine (CLARITIN) 10 MG tablet Take 10 mg by mouth daily.    [provider]  Multiple Vitamin  (MULTIVITAMIN) tablet Take 1 tablet by mouth daily.    [provider]    Allergies: Ceclor [cefaclor]    Review of Systems  Constitutional:  Negative for activity change, appetite change and fever.  HENT:  Negative for congestion and sinus pain.   Eyes:  Negative for visual disturbance.  Respiratory:  Positive for chest tightness. Negative for cough and shortness of breath.   Cardiovascular:  Positive for chest pain.  Gastrointestinal:  Negative for abdominal pain, nausea and vomiting.  Genitourinary:  Negative for dysuria and hematuria.  Skin:  Negative for rash.  Neurological:  Negative for dizziness, weakness, light-headedness and headaches.    all other systems are negative except as noted in the HPI and PMH.   Updated Vital Signs BP (!) 180/112   Pulse 65   Temp (!) 97.5 F (36.4 C)   Resp 18   SpO2 99%   Physical Exam Vitals and nursing note reviewed.  Constitutional:      General: He is not in acute distress.    Appearance: He is well-developed.  HENT:     Head: Normocephalic and atraumatic.     Mouth/Throat:     Pharynx: No oropharyngeal exudate.  Eyes:     Conjunctiva/sclera: Conjunctivae normal.     Pupils: Pupils are equal, round, and reactive to light.  Neck:     Comments: No meningismus. Cardiovascular:     Rate and Rhythm: Normal rate  and regular rhythm.     Heart sounds: Normal heart sounds. No murmur heard. Pulmonary:     Effort: Pulmonary effort is normal. No respiratory distress.     Breath sounds: Normal breath sounds.  Chest:     Chest wall: No tenderness.  Abdominal:     Palpations: Abdomen is soft.     Tenderness: There is no abdominal tenderness. There is no guarding or rebound.  Musculoskeletal:        General: No tenderness. Normal range of motion.     Cervical back: Normal range of motion and neck supple.  Skin:    General: Skin is warm.  Neurological:     General: No focal deficit present.     Mental Status: He is alert  and oriented to person, place, and time. Mental status is at baseline.     Cranial Nerves: No cranial nerve deficit.     Motor: No abnormal muscle tone.     Coordination: Coordination normal.     Comments:  5/5 strength throughout. CN 2-12 intact.Equal grip strength.   Psychiatric:        Behavior: Behavior normal.     (all labs ordered are listed, but only abnormal results are displayed) Labs Reviewed  BASIC METABOLIC PANEL WITH GFR - Abnormal; Notable for the following components:      Result Value   Glucose, Bld 106 (*)    Creatinine, Ser 1.31 (*)    All other components within normal limits  CBC - Abnormal; Notable for the following components:   RBC 5.82 (*)    All other components within normal limits  PROTIME-INR - Abnormal; Notable for the following components:   Prothrombin Time 16.0 (*)    All other components within normal limits  D-DIMER, QUANTITATIVE  APTT  CBC  DIFFERENTIAL  URINE DRUG SCREEN  TROPONIN T, HIGH SENSITIVITY  TROPONIN T, HIGH SENSITIVITY    EKG: EKG Interpretation Date/Time:  Tuesday June 01 2024 17:36:30 EDT Ventricular Rate:  70 PR Interval:  146 QRS Duration:  88 QT Interval:  398 QTC Calculation: 429 R Axis:   24  Text Interpretation: Normal sinus rhythm Normal ECG No previous ECGs available No previous ECGs available Confirmed by Carita Senior [phone]) on 06/01/2024 11:39:09 PM  Radiology: CT ANGIO HEAD NECK W WO CM (CODE STROKE) Result Date: 06/02/2024 EXAM: CTA Head and Neck with and without Intravenous Contrast. CT Head with and without Contrast. CLINICAL HISTORY: Neuro deficit, acute, stroke suspected, sudden vision loss. TECHNIQUE: Axial CTA images of the head and neck performed with and without intravenous contrast. MIP reconstructed images were created and reviewed. Axial computed tomography images of the head/brain performed with and without intravenous contrast. Note: Per PQRS, the description of internal carotid artery  percent stenosis, including 0 percent or normal exam, is based on [employer] American Symptomatic Carotid Endarterectomy Trial (NASCET) criteria. Dose reduction technique was used including one or more of the following: automated exposure control, adjustment of mA and kV according to patient size, and/or iterative reconstruction. CONTRAST: With and without; 75 mL iohexol (OMNIPAQUE) 350 MG/ML injection. COMPARISON: CT from earlier the same day. FINDINGS: CT HEAD: BRAIN: No acute intraparenchymal hemorrhage. No mass lesion. No CT evidence for acute territorial infarct. No midline shift or extra-axial collection. VENTRICLES: No hydrocephalus. ORBITS: The orbits are unremarkable. SINUSES AND MASTOIDS: The paranasal sinuses and mastoid air cells are clear. CTA NECK: COMMON CAROTID ARTERIES: No significant stenosis. No dissection or occlusion. INTERNAL CAROTID ARTERIES: No stenosis by  NASCET criteria. No dissection or occlusion. VERTEBRAL ARTERIES: No significant stenosis. No dissection or occlusion. CTA HEAD: ANTERIOR CEREBRAL ARTERIES: No significant stenosis. No occlusion. No aneurysm. MIDDLE CEREBRAL ARTERIES: No significant stenosis. No occlusion. No aneurysm. POSTERIOR CEREBRAL ARTERIES: No significant stenosis. No occlusion. No aneurysm. BASILAR ARTERY: No significant stenosis. No occlusion. No aneurysm. OTHER: SOFT TISSUES: No acute finding. No masses or lymphadenopathy. BONES: No acute osseous abnormality. IMPRESSION: 1. Negative CTA of the head and neck. No large vessel occlusion or other acute finding. No hemodynamically significant or correctable stenosis. 2. Findings communicated by telephone to Dr. Carita at 2:54 am on 06/02/2024. Electronically signed by: Morene Hoard MD 06/02/2024 02:55 AM EDT RP Workstation: [id number]   CT Angio Chest/Abd/Pel for Dissection W and/or Wo Contrast Result Date: 06/02/2024 EXAM: CTA CHEST, ABDOMEN AND PELVIS WITHOUT AND WITH CONTRAST 06/02/2024 12:48:36 AM  TECHNIQUE: CTA of the chest was performed without and with the administration of 100 mL of intravenous iohexol (OMNIPAQUE) 350 MG/ML injection. CTA of the abdomen and pelvis was performed without and with the administration of 100 mL of intravenous iohexol (OMNIPAQUE) 350 MG/ML injection. Multiplanar reformatted images are provided for review. MIP images are provided for review. Automated exposure control, iterative reconstruction, and/or weight based adjustment of the mA/kV was utilized to reduce the radiation dose to as low as reasonably achievable. COMPARISON: None available. CLINICAL HISTORY: FINDINGS: VASCULATURE: AORTA: No acute finding. No abdominal aortic aneurysm. No dissection. PULMONARY ARTERIES: No pulmonary embolism with the limits of this exam. GREAT VESSELS OF AORTIC ARCH: No acute finding. No dissection. No arterial occlusion or significant stenosis. CELIAC TRUNK: No acute finding. No occlusion or significant stenosis. SUPERIOR MESENTERIC ARTERY: No acute finding. No occlusion or significant stenosis. INFERIOR MESENTERIC ARTERY: No acute finding. No occlusion or significant stenosis. RENAL ARTERIES: No acute finding. No occlusion or significant stenosis. ILIAC ARTERIES: No acute finding. No occlusion or significant stenosis. CHEST: MEDIASTINUM: No mediastinal lymphadenopathy. The heart and pericardium demonstrate no acute abnormality. LUNGS AND PLEURA: The lungs demonstrate a subpleural 4 mm right lower lobe pulmonary nodule (9:100). No focal consolidation or pulmonary edema. No evidence of pleural effusion or pneumothorax. THORACIC BONES AND SOFT TISSUES: No acute bone or soft tissue abnormality. ABDOMEN AND PELVIS: LIVER: The liver is unremarkable. GALLBLADDER AND BILE DUCTS: Gallbladder is unremarkable. No biliary ductal dilatation. SPLEEN: The spleen is unremarkable. PANCREAS: The pancreas is unremarkable. ADRENAL GLANDS: Bilateral adrenal glands demonstrate no acute abnormality. KIDNEYS, URETERS  AND BLADDER: Atrophic right kidney with marked renal cortical scarring and thinning. Associated severe hydronephrosis versus parapelvic cysts. The left kidney is unremarkable. No stones in the kidneys or ureters. No perinephric or periureteral stranding. Urinary bladder is unremarkable. GI AND BOWEL: Stomach and duodenal sweep demonstrate no acute abnormality. No small or large bowel wall thickening. No abnormal bowel wall distension. Stool throughout the majority of the colon. Fecalized material within the distal small bowel may be due to incompetent ileocecal valve or slow transition state. The appendix is unremarkable. REPRODUCTIVE: Reproductive organs are unremarkable. PERITONEUM AND RETROPERITONEUM: No ascites or free air. LYMPH NODES: No lymphadenopathy. ABDOMINAL BONES AND SOFT TISSUES: No acute abnormality of the bones. No acute soft tissue abnormality. IMPRESSION: 1. No evidence of acute aortic syndrome. 2. Atrophic right kidney with marked cortical scarring and thinning with associated severe hydronephrosis versus parapelvic cysts. 3. Subpleural 4 mm right lower lobe pulmonary nodule; no routine follow-up imaging is recommended per Fleischner Society Guidelines for a single solid nodule 5 mm in patients without  high-risk features. 4. Constipation /slow transition state. Electronically signed by: Morgane Naveau MD 06/02/2024 12:58 AM EDT RP Workstation: [id number]   CT Head Wo Contrast Result Date: 06/02/2024 EXAM: CT HEAD WITHOUT CONTRAST 06/02/2024 12:48:36 AM TECHNIQUE: CT of the head was performed without the administration of intravenous contrast. Automated exposure control, iterative reconstruction, and/or weight based adjustment of the mA/kV was utilized to reduce the radiation dose to as low as reasonably achievable. COMPARISON: None available. CLINICAL HISTORY: Headache, sudden, severe. Reports HTN at PCP. Was prescribed BP meds today at PCP. Denies CP or SHOB. FINDINGS: BRAIN AND VENTRICLES:  No acute hemorrhage. No evidence of acute infarct. No hydrocephalus. No extra-axial collection. No mass effect or midline shift. ORBITS: No acute abnormality. SINUSES: Polypoid mucosal thickening of bilateral maxillary sinuses. SOFT TISSUES AND SKULL: No acute soft tissue abnormality. No skull fracture. IMPRESSION: 1. No acute intracranial abnormality. Electronically signed by: Morgane Naveau MD 06/02/2024 12:52 AM EDT RP Workstation: [id number]   DG Chest 2 View Result Date: 06/01/2024 CLINICAL DATA:  Chest pain. EXAM: CHEST - 2 VIEW COMPARISON:  Chest radiograph dated 09/27/2009. FINDINGS: No focal consolidation, pleural effusion, or pneumothorax. The cardiac silhouette is within normal limits. No acute osseous pathology. IMPRESSION: No active cardiopulmonary disease. Electronically Signed   By: Vanetta Chou M.D.   On: 06/01/2024 18:00     .Critical Care  Performed by: Carita Senior, MD Authorized by: Carita Senior, MD   Critical care provider statement:    Critical care time (minutes):  35   Critical care time was exclusive of:  Separately billable procedures and treating other patients   Critical care was necessary to treat or prevent imminent or life-threatening deterioration of the following conditions:  CNS failure or compromise   Critical care was time spent personally by me on the following activities:  Development of treatment plan with patient or surrogate, discussions with consultants, evaluation of patient's response to treatment, examination of patient, ordering and review of laboratory studies, ordering and review of radiographic studies, ordering and performing treatments and interventions, pulse oximetry, re-evaluation of patient's condition, review of old charts, blood draw for specimens and obtaining history from patient or surrogate   I assumed direction of critical care for this patient from another provider in my specialty: no     Care discussed with: admitting  provider      Medications Ordered in the ED  amLODipine (NORVASC) tablet 5 mg (has no administration in time range)                                    Medical Decision Making Amount and/or Complexity of Data Reviewed Labs: ordered. Decision-making details documented in ED Course. Radiology: ordered and independent interpretation performed. Decision-making details documented in ED Course. ECG/medicine tests: ordered and independent interpretation performed. Decision-making details documented in ED Course.  Risk Prescription drug management.   Elevated blood pressure with history of same, was prescribed losartan today which she has not yet filled..  No thunderclap onset headache.  Some chest tightness.  No focal weakness, numbness or tingling.  No visual changes.  EKG is sinus rhythm without acute ST changes  Creatinine appears to be near baseline.  Hypertension noted.  Denies significant chest pain or shortness of breath.  No thunderclap onset headache.  Low concern for subarachnoid hemorrhage or meningitis.  Neurological exam is nonfocal.  CT head negative for hemorrhage.  CT chest abdomen  pelvis negative for arctic aneurysm or dissection.  Patient made aware of atrophic right kidney as well as lung nodule.  Workup was reassuring without evidence of significant endorgan damage.  Creatinine is at baseline.  Blood pressure improved to 160/100.  Approximately 2 AM, patient developed blurry central vision.  He describes sensation of not being able to see from his left periphery blurry central vision.  Does appear to have left-sided visual field cut.  Code stroke was activated.  Bedside ultrasound does not show any obvious retinal detachment.  Code stroke was activated.  Patient did have left-sided visual field cut.  CTA shows no hemorrhage and no large vessel occlusion.  Discussed with neurology Dr. Adonis By the time he saw patient, symptoms have resolved and his vision is back  to baseline.  He remains hypertensive. Concern for possible ocular migraine versus CVA.  Will need to be transferred for MRI.  No indication for TNK given resolution of symptoms and NIH 0.  Patient's feels improved and feels his vision is back to baseline.  Blood pressure has improved to 145/96.  Neurology does not recommend permissive hypertension but does recommend MRI for further assessment of his visual changes.  Favor ocular migraine.  Patient to be transferred by private vehicle to [facility] for MRI.  Discussed with Dr. Jerral.  Patient instructed to fill his losartan which was prescribed by his PCP.  Follow-up with urology as well given his atrophic kidney.     Final diagnoses:  Visual changes  Hypertensive urgency    ED Discharge Orders     None          Taison Celani, Garnette, MD 06/02/24 [phone]

## 2024-06-02 NOTE — ED Notes (Signed)
 Patient in MRI at time of this RN assuming care

## 2024-06-11 ENCOUNTER — Other Ambulatory Visit (HOSPITAL_COMMUNITY): Payer: Self-pay | Admitting: Family Medicine

## 2024-06-11 DIAGNOSIS — N261 Atrophy of kidney (terminal): Secondary | ICD-10-CM

## 2024-06-16 ENCOUNTER — Ambulatory Visit (HOSPITAL_COMMUNITY)
Admission: RE | Admit: 2024-06-16 | Discharge: 2024-06-16 | Disposition: A | Discharge status: Home / Self Care | Source: Ambulatory Visit | Attending: Urology | Admitting: Urology

## 2024-06-16 ENCOUNTER — Ambulatory Visit (HOSPITAL_COMMUNITY)
Admission: RE | Admit: 2024-06-16 | Discharge: 2024-06-16 | Disposition: A | Discharge status: Home / Self Care | Source: Ambulatory Visit | Attending: Family Medicine | Admitting: Family Medicine

## 2024-06-16 DIAGNOSIS — N5089 Other specified disorders of the male genital organs: Secondary | ICD-10-CM | POA: Diagnosis present

## 2024-06-16 DIAGNOSIS — N261 Atrophy of kidney (terminal): Secondary | ICD-10-CM | POA: Insufficient documentation
